# Patient Record
Sex: Male | Born: 1995 | Race: Black or African American | Hispanic: No | Marital: Single | Smoking: Never smoker
Health system: Southern US, Community
[De-identification: ages and names within clinical notes are randomized; demographics above are authoritative.]

## PROBLEM LIST (undated history)

## (undated) DIAGNOSIS — J45909 Unspecified asthma, uncomplicated: Secondary | ICD-10-CM

## (undated) DIAGNOSIS — A549 Gonococcal infection, unspecified: Secondary | ICD-10-CM

## (undated) HISTORY — PX: WISDOM TOOTH EXTRACTION: SHX21

---
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
## 2018-05-24 ENCOUNTER — Other Ambulatory Visit: Payer: Self-pay

## 2018-05-24 ENCOUNTER — Emergency Department (HOSPITAL_BASED_OUTPATIENT_CLINIC_OR_DEPARTMENT_OTHER)
Admission: EM | Admit: 2018-05-24 | Discharge: 2018-05-24 | Disposition: A | Discharge status: Home / Self Care | Payer: BLUE CROSS/BLUE SHIELD | Attending: Emergency Medicine | Admitting: Emergency Medicine

## 2018-05-24 ENCOUNTER — Encounter (HOSPITAL_BASED_OUTPATIENT_CLINIC_OR_DEPARTMENT_OTHER): Payer: Self-pay

## 2018-05-24 DIAGNOSIS — Z202 Contact with and (suspected) exposure to infections with a predominantly sexual mode of transmission: Secondary | ICD-10-CM | POA: Diagnosis not present

## 2018-05-24 HISTORY — DX: Gonococcal infection, unspecified: A54.9

## 2018-05-24 LAB — URINALYSIS, ROUTINE W REFLEX MICROSCOPIC
Bilirubin Urine: NEGATIVE
GLUCOSE, UA: NEGATIVE mg/dL
HGB URINE DIPSTICK: NEGATIVE
KETONES UR: NEGATIVE mg/dL
LEUKOCYTES UA: NEGATIVE
Nitrite: NEGATIVE
PH: 8 (ref 5.0–8.0)
PROTEIN: NEGATIVE mg/dL
Specific Gravity, Urine: 1.02 (ref 1.005–1.030)

## 2018-05-24 MED ORDER — AZITHROMYCIN 250 MG PO TABS
1000.0000 mg | ORAL_TABLET | Freq: Once | ORAL | Status: AC
  Administered 2018-05-24: 1000 mg via ORAL
  Filled 2018-05-24: qty 4

## 2018-05-24 MED ORDER — CEFTRIAXONE SODIUM 250 MG IJ SOLR
250.0000 mg | Freq: Once | INTRAMUSCULAR | Status: AC
  Administered 2018-05-24: 250 mg via INTRAMUSCULAR
  Filled 2018-05-24: qty 250

## 2018-05-24 NOTE — Discharge Instructions (Signed)

## 2018-05-24 NOTE — ED Notes (Signed)
Pt verbalizes understanding of d/c instructions and denies any further needs at this time. 

## 2018-05-24 NOTE — ED Provider Notes (Signed)
Emergency Department Provider Note   I have reviewed the triage vital signs and the nursing notes.   HISTORY  Chief Complaint No chief complaint on file.   HPI Alejandro Lyons is a 22 y.o. male presents to the ED with symptoms similar to prior STDs. Patient has been tested and treated in the last month for STI symptoms. He notes change in semen consistency and slight darker color. He feels some urethral itching but denies any urethral discharge. Patient was [facility]. He was tested for HIV during his most recent event. He had unprotected oral sex with someone shortly afterwards and began having symptoms again shortly afterwards. No testicle pain. No fever/chills.   Past Medical History:  Diagnosis Date  . Gonorrhea     There are no active problems to display for this patient.   Past Surgical History:  Procedure Laterality Date  . WISDOM TOOTH EXTRACTION        Allergies Patient has no known allergies.  No family history on file.  Social History Social History   Tobacco Use  . Smoking status: Never Smoker  . Smokeless tobacco: Never Used  Substance Use Topics  . Alcohol use: Yes    Comment: occ  . Drug use: Never    Review of Systems  Constitutional: No fever/chills Genitourinary: Negative for dysuria. Positive change in semen consistency and itching feeling.  Musculoskeletal: Negative for back pain. Skin: Negative for rash.  10-point ROS otherwise negative.  ____________________________________________   PHYSICAL EXAM:  VITAL SIGNS: ED Triage Vitals  Enc Vitals Group     BP 05/24/18 2033 134/85     Pulse Rate 05/24/18 2033 79     Resp 05/24/18 2033 18     Temp 05/24/18 2033 98.3 F (36.8 C)     Temp Source 05/24/18 2033 Oral     SpO2 05/24/18 2033 100 %     Weight 05/24/18 2034 155 lb (70.3 kg)     Height 05/24/18 2034 6' (1.829 m)     Pain Score 05/24/18 2031 5   Constitutional: Alert and  oriented. Well appearing and in no acute distress. Eyes: Conjunctivae are normal. Head: Atraumatic. Nose: No congestion/rhinnorhea. Mouth/Throat: Mucous membranes are moist. Neck: No stridor.   Genitourinary: Normal circumcised external genitalia. No urethral discharge. No testicle tenderness.   Musculoskeletal: No gross deformities of extremities. Neurologic:  Normal speech and language. Skin: No rash noted.  ____________________________________________   LABS (all labs ordered are listed, but only abnormal results are displayed)  Labs Reviewed  URINALYSIS, ROUTINE W REFLEX MICROSCOPIC - Abnormal; Notable for the following components:      Result Value   APPearance CLOUDY (*)    All other components within normal limits  GC/CHLAMYDIA PROBE AMP ([place]) NOT AT [facility]   ____________________________________________   PROCEDURES  Procedure(s) performed:   Procedures  None ____________________________________________   INITIAL IMPRESSION / ASSESSMENT AND PLAN / ED COURSE  Pertinent labs & imaging results that were available during my care of the patient were reviewed by me and considered in my medical decision making (see chart for details).   Patient presents to the ED with symptoms similar to recent STD. He has had an unprotected sexual encounter since his most recent STD treatment. Elect to cover empirically here. Had HIV testing in the last 30 days. Will not repeat here. Discussed safe sex practices and encouraged close f/u with student health shoulder symptoms persist.    ____________________________________________  FINAL CLINICAL IMPRESSION(S) / ED DIAGNOSES  Final diagnoses:  STD exposure     MEDICATIONS GIVEN DURING THIS VISIT:  Medications  azithromycin (ZITHROMAX) tablet 1,000 mg (1,000 mg Oral Given 05/24/18 2055)  cefTRIAXone (ROCEPHIN) injection 250 mg (250 mg Intramuscular Given 05/24/18 2056)    Note:  This document was prepared using Dragon  voice recognition software and may include unintentional dictation errors.  Alona Bene, MD Emergency Medicine    , Arlyss Repress, MD 05/25/18 [phone]

## 2018-05-24 NOTE — ED Triage Notes (Signed)
C/o change in semen concentration x 2 days-feels same as when he had STD-NAD-steady gait

## 2018-05-25 LAB — GC/CHLAMYDIA PROBE AMP (~~LOC~~) NOT AT ARMC
CHLAMYDIA, DNA PROBE: NEGATIVE
Neisseria Gonorrhea: NEGATIVE

## 2019-01-11 ENCOUNTER — Emergency Department (HOSPITAL_BASED_OUTPATIENT_CLINIC_OR_DEPARTMENT_OTHER)
Admission: EM | Admit: 2019-01-11 | Discharge: 2019-01-11 | Disposition: A | Discharge status: Home / Self Care | Payer: BC Managed Care – PPO | Attending: Emergency Medicine | Admitting: Emergency Medicine

## 2019-01-11 ENCOUNTER — Other Ambulatory Visit: Payer: Self-pay

## 2019-01-11 ENCOUNTER — Encounter (HOSPITAL_BASED_OUTPATIENT_CLINIC_OR_DEPARTMENT_OTHER): Payer: Self-pay | Admitting: *Deleted

## 2019-01-11 DIAGNOSIS — J45909 Unspecified asthma, uncomplicated: Secondary | ICD-10-CM | POA: Diagnosis not present

## 2019-01-11 DIAGNOSIS — R195 Other fecal abnormalities: Secondary | ICD-10-CM | POA: Insufficient documentation

## 2019-01-11 HISTORY — DX: Unspecified asthma, uncomplicated: J45.909

## 2019-01-11 NOTE — Discharge Instructions (Addendum)
Follow up with GI, referral given. Call for your test results in 3-5 days or check your mychart, if your test is positive you may go to the health department for treatment.

## 2019-01-11 NOTE — ED Provider Notes (Signed)
[facility] EMERGENCY DEPARTMENT Provider Note   CSN: [id number] Arrival date & time: 01/11/19  1359    History   Chief Complaint Chief Complaint  Patient presents with  . Mucous in stool    HPI Anastasio Ebony CargoFelton Stackhouse is a 23 y.o. male.     23 year old male presents with complaint of mucus in his stool for the past month.  Patient notes sometimes going to the bathroom and only having stool in the commode.  Patient denies any blood, abdominal pain, nausea, vomiting, fevers.  Denies unintentional weight loss.  Patient eats a generally healthy, high-fiber diet.  Patient reports doing a colon cleanse around the time his symptoms started and is unsure if it is because he used warm water (instructions specifically said not to use warm water).  Patient participates in anal intercourse, states has not been sexually active since his symptoms started.  No personal or family history of colitis, IBS, Crohn's disease.  No other complaints or concerns.     Past Medical History:  Diagnosis Date  . Asthma   . Gonorrhea     There are no active problems to display for this patient.   Past Surgical History:  Procedure Laterality Date  . WISDOM TOOTH EXTRACTION          Home Medications    Prior to Admission medications   Not on File    Family History History reviewed. No pertinent family history.  Social History Social History   Tobacco Use  . Smoking status: Never Smoker  . Smokeless tobacco: Never Used  Substance Use Topics  . Alcohol use: Yes    Alcohol/week: 1.0 standard drinks    Types: 1 Shots of liquor per week    Comment: occ  . Drug use: Never     Allergies   Patient has no known allergies.   Review of Systems Review of Systems  Constitutional: Negative for appetite change, chills, fever and unexpected weight change.  Gastrointestinal: Negative for abdominal pain, anal bleeding, blood in stool, constipation, diarrhea, nausea and vomiting.   Skin: Negative for rash and wound.  Allergic/Immunologic: Negative for immunocompromised state.  Neurological: Negative for weakness.  Psychiatric/Behavioral: Negative for confusion.  All other systems reviewed and are negative.    Physical Exam Updated Vital Signs BP (!) 142/94 (BP Location: Right Arm)   Pulse 100   Temp 98.3 F (36.8 C)   Resp 18   Ht 6\' 1"  (1.854 m)   Wt 71.9 kg   SpO2 100%   BMI 20.90 kg/m   Physical Exam Vitals signs and nursing note reviewed. Exam conducted with a chaperone present.  Constitutional:      General: He is not in acute distress.    Appearance: He is well-developed. He is not diaphoretic.  HENT:     Head: Normocephalic and atraumatic.  Cardiovascular:     Rate and Rhythm: Normal rate and regular rhythm.     Pulses: Normal pulses.     Heart sounds: Normal heart sounds.  Pulmonary:     Effort: Pulmonary effort is normal.  Abdominal:     General: Abdomen is flat.     Palpations: Abdomen is soft.     Tenderness: There is no abdominal tenderness.  Skin:    General: Skin is warm and dry.     Findings: No erythema or rash.  Neurological:     Mental Status: He is alert and oriented to person, place, and time.  Psychiatric:  Behavior: Behavior normal.      ED Treatments / Results  Labs (all labs ordered are listed, but only abnormal results are displayed) Labs Reviewed  GC/CHLAMYDIA PROBE AMP ([place]) NOT AT [facility]    EKG None  Radiology No results found.  Procedures Procedures (including critical care time)  Medications Ordered in ED Medications - No data to display   Initial Impression / Assessment and Plan / ED Course  I have reviewed the triage vital signs and the nursing notes.  Pertinent labs & imaging results that were available during my care of the patient were reviewed by me and considered in my medical decision making (see chart for details).  Clinical Course as of Jan 11 1552  Wed Jan 10, 3325   7639 23 year old male with complaint of mucus per rectum and mucus in his stool for the past month.  Patient does not have abdominal pain, blood in the stool or mucus, fevers, weight loss.  Abdomen is soft and nontender.  Offered patient gonorrhea chlamydia testing for STD, patient agreeable, swab obtained from the rectum with RN assistant chaperone.  Patient to call or check his MyChart up in 3 to 5 days, present to health department for treatment if needed.  Referred to gastroenterology for follow-up for report of mucus in stool x1 month.  Advised patient this may be diet related as he does not appear ill or have any symptoms correlating with illness today.   [LM]    Clinical Course User Index [LM] Tacy Learn, PA-C      Final Clinical Impressions(s) / ED Diagnoses   Final diagnoses:  Mucous in stools    ED Discharge Orders    None       Roque Lias 01/11/19 1553    Dorie Rank, MD 01/12/19 1202

## 2019-01-11 NOTE — ED Triage Notes (Signed)
Mucous in stool for a month. Some pressure to hypogastric area.

## 2019-01-12 LAB — GC/CHLAMYDIA PROBE AMP (~~LOC~~) NOT AT ARMC
Chlamydia: NEGATIVE
Neisseria Gonorrhea: POSITIVE — AB

## 2019-01-14 ENCOUNTER — Encounter (HOSPITAL_BASED_OUTPATIENT_CLINIC_OR_DEPARTMENT_OTHER): Payer: Self-pay | Admitting: Emergency Medicine

## 2019-01-14 ENCOUNTER — Emergency Department (HOSPITAL_BASED_OUTPATIENT_CLINIC_OR_DEPARTMENT_OTHER)
Admission: EM | Admit: 2019-01-14 | Discharge: 2019-01-14 | Disposition: A | Discharge status: Home / Self Care | Payer: BC Managed Care – PPO | Attending: Emergency Medicine | Admitting: Emergency Medicine

## 2019-01-14 ENCOUNTER — Other Ambulatory Visit: Payer: Self-pay

## 2019-01-14 DIAGNOSIS — J45909 Unspecified asthma, uncomplicated: Secondary | ICD-10-CM | POA: Insufficient documentation

## 2019-01-14 DIAGNOSIS — A549 Gonococcal infection, unspecified: Secondary | ICD-10-CM | POA: Insufficient documentation

## 2019-01-14 MED ORDER — DOXYCYCLINE HYCLATE 100 MG PO CAPS: 100.0000 mg | ORAL_CAPSULE | Freq: Two times a day (BID) | ORAL | 0 refills | Status: AC

## 2019-01-14 MED ORDER — DOXYCYCLINE HYCLATE 100 MG PO CAPS: 100.0000 mg | ORAL_CAPSULE | Freq: Two times a day (BID) | ORAL | 0 refills | Status: DC

## 2019-01-14 MED ORDER — CEFTRIAXONE SODIUM 250 MG IJ SOLR
250.0000 mg | INTRAMUSCULAR | Status: DC
  Administered 2019-01-14: 250 mg via INTRAMUSCULAR
  Filled 2019-01-14: qty 250

## 2019-01-14 NOTE — ED Triage Notes (Signed)
Patient states that he was here the other day and came back because one of his tests say that he has gonorrhea

## 2019-01-14 NOTE — ED Notes (Signed)
Patient on med hold until 1700

## 2019-01-14 NOTE — Discharge Instructions (Addendum)
Make sure partner(s) are aware of their results and treated, this can be done at the health department. Consider follow up with your doctor or the health department for HIV testing.

## 2019-01-14 NOTE — ED Provider Notes (Signed)
[facility] EMERGENCY DEPARTMENT Provider Note   CSN: [id number] Arrival date & time: 01/14/19  1556    History   Chief Complaint Chief Complaint  Patient presents with  . Follow-up    HPI Alejandro Lyons is a 23 y.o. male.     23 year old male presents for treatment of gonorrhea.  Patient was seen in the emergency room 3 days ago with report of mucous stools, rectal swab for gonorrhea and results are positive.  No further complaints or concerns today.     Past Medical History:  Diagnosis Date  . Asthma   . Gonorrhea     There are no active problems to display for this patient.   Past Surgical History:  Procedure Laterality Date  . WISDOM TOOTH EXTRACTION          Home Medications    Prior to Admission medications   Medication Sig Start Date End Date Taking? Authorizing Provider  doxycycline (VIBRAMYCIN) 100 MG capsule Take 1 capsule (100 mg total) by mouth 2 (two) times daily for 7 days. 01/14/19 01/21/19  Jeannie FendMurphy, Starkeisha Vanwinkle A, PA-C    Family History History reviewed. No pertinent family history.  Social History Social History   Tobacco Use  . Smoking status: Never Smoker  . Smokeless tobacco: Never Used  Substance Use Topics  . Alcohol use: Yes    Alcohol/week: 1.0 standard drinks    Types: 1 Shots of liquor per week    Comment: occ  . Drug use: Never     Allergies   Patient has no known allergies.   Review of Systems Review of Systems  Constitutional: Negative for fever.  Musculoskeletal: Negative for arthralgias, joint swelling and myalgias.  Skin: Negative for rash and wound.  Allergic/Immunologic: Negative for immunocompromised state.  All other systems reviewed and are negative.    Physical Exam Updated Vital Signs BP (!) 125/58 (BP Location: Right Arm)   Pulse 88   Temp 98.5 F (36.9 C) (Oral)   Resp 18   Ht 6\' 1"  (1.854 m)   Wt 71.9 kg   SpO2 100%   BMI 20.91 kg/m   Physical Exam Vitals signs and  nursing note reviewed.  Constitutional:      General: He is not in acute distress.    Appearance: He is well-developed. He is not diaphoretic.  HENT:     Head: Normocephalic and atraumatic.  Pulmonary:     Effort: Pulmonary effort is normal.  Skin:    General: Skin is warm and dry.     Findings: No erythema or rash.  Neurological:     Mental Status: He is alert and oriented to person, place, and time.  Psychiatric:        Behavior: Behavior normal.      ED Treatments / Results  Labs (all labs ordered are listed, but only abnormal results are displayed) Labs Reviewed - No data to display  EKG None  Radiology No results found.  Procedures Procedures (including critical care time)  Medications Ordered in ED Medications  cefTRIAXone (ROCEPHIN) injection 250 mg (has no administration in time range)     Initial Impression / Assessment and Plan / ED Course  I have reviewed the triage vital signs and the nursing notes.  Pertinent labs & imaging results that were available during my care of the patient were reviewed by me and considered in my medical decision making (see chart for details).  Clinical Course as of Jan 13 1622  Sat  Jan 13, 3190  2238 23 year old male returns to ER for treatment of gonorrhea.  Patient will be given Rocephin IM, prescription for doxycycline x7 days.  Offered additional testing for HIV and syphilis, patient declines at this time will follow-up with his PCP.  Patient is advised to inform all partners for treatment.   [LM]    Clinical Course User Index [LM] Tacy Learn, PA-C      Final Clinical Impressions(s) / ED Diagnoses   Final diagnoses:  Gonorrhea    ED Discharge Orders         Ordered    doxycycline (VIBRAMYCIN) 100 MG capsule  2 times daily,   Status:  Discontinued     01/14/19 1615    doxycycline (VIBRAMYCIN) 100 MG capsule  2 times daily     01/14/19 1618           Tacy Learn, PA-C 01/14/19 1624     Gareth Morgan, MD 01/14/19 1723

## 2020-10-05 ENCOUNTER — Emergency Department (HOSPITAL_BASED_OUTPATIENT_CLINIC_OR_DEPARTMENT_OTHER)
Admission: EM | Admit: 2020-10-05 | Discharge: 2020-10-05 | Disposition: A | Discharge status: Home / Self Care | Payer: BLUE CROSS/BLUE SHIELD | Attending: Emergency Medicine | Admitting: Emergency Medicine

## 2020-10-05 ENCOUNTER — Other Ambulatory Visit: Payer: Self-pay

## 2020-10-05 ENCOUNTER — Encounter (HOSPITAL_BASED_OUTPATIENT_CLINIC_OR_DEPARTMENT_OTHER): Payer: Self-pay | Admitting: Emergency Medicine

## 2020-10-05 DIAGNOSIS — J45909 Unspecified asthma, uncomplicated: Secondary | ICD-10-CM | POA: Diagnosis not present

## 2020-10-05 DIAGNOSIS — N342 Other urethritis: Secondary | ICD-10-CM | POA: Diagnosis not present

## 2020-10-05 DIAGNOSIS — R369 Urethral discharge, unspecified: Secondary | ICD-10-CM | POA: Diagnosis present

## 2020-10-05 LAB — URINALYSIS, ROUTINE W REFLEX MICROSCOPIC
Bilirubin Urine: NEGATIVE
Glucose, UA: NEGATIVE mg/dL
Ketones, ur: NEGATIVE mg/dL
Nitrite: NEGATIVE
Protein, ur: NEGATIVE mg/dL
Specific Gravity, Urine: 1.02 (ref 1.005–1.030)
pH: 6 (ref 5.0–8.0)

## 2020-10-05 LAB — URINALYSIS, MICROSCOPIC (REFLEX)

## 2020-10-05 MED ORDER — LIDOCAINE HCL (PF) 1 % IJ SOLN
2.0000 mL | Freq: Once | INTRAMUSCULAR | Status: AC
  Administered 2020-10-05: 2 mL
  Filled 2020-10-05: qty 5

## 2020-10-05 MED ORDER — CEFTRIAXONE SODIUM 500 MG IJ SOLR
500.0000 mg | Freq: Once | INTRAMUSCULAR | Status: AC
  Administered 2020-10-05: 500 mg via INTRAMUSCULAR
  Filled 2020-10-05: qty 500

## 2020-10-05 MED ORDER — DOXYCYCLINE HYCLATE 100 MG PO TABS: 100.0000 mg | ORAL_TABLET | Freq: Two times a day (BID) | ORAL | 0 refills | Status: DC

## 2020-10-05 NOTE — ED Provider Notes (Signed)
[facility] EMERGENCY DEPARTMENT Provider Note   CSN: [id number] Arrival date & time: 10/05/20  1901     History Chief Complaint  Patient presents with  . Penile Discharge    Alejandro Lyons is a 25 y.o. male.  The history is provided by the patient. No language interpreter was used.  Penile Discharge This is a new problem. The current episode started 12 to 24 hours ago. The problem occurs constantly. The problem has not changed since onset.Nothing aggravates the symptoms. Nothing relieves the symptoms.  Pt complains of penile discharge.  Pt reports possible std.      Past Medical History:  Diagnosis Date  . Asthma   . Gonorrhea     There are no problems to display for this patient.   Past Surgical History:  Procedure Laterality Date  . WISDOM TOOTH EXTRACTION         History reviewed. No pertinent family history.  Social History   Tobacco Use  . Smoking status: Never Smoker  . Smokeless tobacco: Never Used  Substance Use Topics  . Alcohol use: Yes    Alcohol/week: 1.0 standard drink    Types: 1 Shots of liquor per week    Comment: occ  . Drug use: Never    Home Medications Prior to Admission medications   Medication Sig Start Date End Date Taking? Authorizing Provider  doxycycline (VIBRA-TABS) 100 MG tablet Take 1 tablet (100 mg total) by mouth 2 (two) times daily. 10/05/20  Yes Elson Areas, PA-C    Allergies    Patient has no known allergies.  Review of Systems   Review of Systems  Genitourinary: Positive for penile discharge.  All other systems reviewed and are negative.   Physical Exam Updated Vital Signs BP 140/82   Pulse 81   Temp 98.3 F (36.8 C) (Oral)   Resp 20   Ht 6\' 1"  (1.854 m)   Wt 83.9 kg   SpO2 100%   BMI 24.41 kg/m   Physical Exam Vitals and nursing note reviewed.  Constitutional:      Appearance: He is well-developed.  HENT:     Head: Normocephalic and atraumatic.  Eyes:      Conjunctiva/sclera: Conjunctivae normal.  Cardiovascular:     Rate and Rhythm: Normal rate and regular rhythm.     Heart sounds: No murmur heard.   Pulmonary:     Effort: Pulmonary effort is normal. No respiratory distress.     Breath sounds: Normal breath sounds.  Abdominal:     Palpations: Abdomen is soft.     Tenderness: There is no abdominal tenderness.  Musculoskeletal:     Cervical back: Neck supple.  Skin:    General: Skin is warm and dry.  Neurological:     Mental Status: He is alert.  Psychiatric:        Mood and Affect: Mood normal.     ED Results / Procedures / Treatments   Labs (all labs ordered are listed, but only abnormal results are displayed) Labs Reviewed  URINALYSIS, ROUTINE W REFLEX MICROSCOPIC  GC/CHLAMYDIA PROBE AMP ([place]) NOT AT [facility]    EKG None  Radiology No results found.  Procedures Procedures   Medications Ordered in ED Medications  cefTRIAXone (ROCEPHIN) injection 500 mg (has no administration in time range)    ED Course  I have reviewed the triage vital signs and the nursing notes.  Pertinent labs & imaging results that were available during my care of the  patient were reviewed by me and considered in my medical decision making (see chart for details).    MDM Rules/Calculators/A&P                          gc and ct pending.  Pt declined rpr and hiv.  Pt given Rocephin 500mg  Im.   Final Clinical Impression(s) / ED Diagnoses Final diagnoses:  Urethritis    Rx / DC Orders ED Discharge Orders         Ordered    doxycycline (VIBRA-TABS) 100 MG tablet  2 times daily        10/05/20 1949        An After Visit Summary was printed and given to the patient.   12/05/20 10/05/20 1951    12/05/20, MD 10/05/20 2012

## 2020-10-05 NOTE — ED Triage Notes (Signed)
Reports penile discharge and dysuria since yesterday. Told by partner to get checked for STDs.

## 2020-10-05 NOTE — Discharge Instructions (Addendum)
Return if any problems.

## 2020-10-07 LAB — GC/CHLAMYDIA PROBE AMP (~~LOC~~) NOT AT ARMC
Chlamydia: NEGATIVE
Comment: NEGATIVE
Comment: NORMAL
Neisseria Gonorrhea: POSITIVE — AB

## 2020-12-13 ENCOUNTER — Other Ambulatory Visit: Payer: Self-pay

## 2020-12-13 ENCOUNTER — Encounter (HOSPITAL_BASED_OUTPATIENT_CLINIC_OR_DEPARTMENT_OTHER): Payer: Self-pay

## 2020-12-13 ENCOUNTER — Emergency Department (HOSPITAL_BASED_OUTPATIENT_CLINIC_OR_DEPARTMENT_OTHER)
Admission: EM | Admit: 2020-12-13 | Discharge: 2020-12-13 | Disposition: A | Discharge status: Home / Self Care | Payer: BC Managed Care – PPO | Attending: Emergency Medicine | Admitting: Emergency Medicine

## 2020-12-13 DIAGNOSIS — R300 Dysuria: Secondary | ICD-10-CM | POA: Diagnosis not present

## 2020-12-13 DIAGNOSIS — J45909 Unspecified asthma, uncomplicated: Secondary | ICD-10-CM | POA: Diagnosis not present

## 2020-12-13 DIAGNOSIS — R369 Urethral discharge, unspecified: Secondary | ICD-10-CM | POA: Diagnosis not present

## 2020-12-13 DIAGNOSIS — Z202 Contact with and (suspected) exposure to infections with a predominantly sexual mode of transmission: Secondary | ICD-10-CM | POA: Diagnosis not present

## 2020-12-13 DIAGNOSIS — Z711 Person with feared health complaint in whom no diagnosis is made: Secondary | ICD-10-CM

## 2020-12-13 LAB — URINALYSIS, ROUTINE W REFLEX MICROSCOPIC
Bilirubin Urine: NEGATIVE
Glucose, UA: NEGATIVE mg/dL
Hgb urine dipstick: NEGATIVE
Ketones, ur: NEGATIVE mg/dL
Leukocytes,Ua: NEGATIVE
Nitrite: NEGATIVE
Protein, ur: NEGATIVE mg/dL
Specific Gravity, Urine: 1.01 (ref 1.005–1.030)
pH: 7.5 (ref 5.0–8.0)

## 2020-12-13 LAB — RAPID HIV SCREEN (HIV 1/2 AB+AG)
HIV 1/2 Antibodies: NONREACTIVE
HIV-1 P24 Antigen - HIV24: NONREACTIVE

## 2020-12-13 MED ORDER — DOXYCYCLINE HYCLATE 100 MG PO CAPS: 100.0000 mg | ORAL_CAPSULE | Freq: Two times a day (BID) | ORAL | 0 refills | Status: AC

## 2020-12-13 MED ORDER — DOXYCYCLINE HYCLATE 100 MG PO TABS
100.0000 mg | ORAL_TABLET | Freq: Once | ORAL | Status: AC
  Administered 2020-12-13: 20:00:00 100 mg via ORAL
  Filled 2020-12-13: qty 1

## 2020-12-13 MED ORDER — LIDOCAINE HCL (PF) 1 % IJ SOLN
1.0000 mL | Freq: Once | INTRAMUSCULAR | Status: AC
  Administered 2020-12-13: 20:00:00 1 mL
  Filled 2020-12-13: qty 5

## 2020-12-13 MED ORDER — CEFTRIAXONE SODIUM 500 MG IJ SOLR
500.0000 mg | Freq: Once | INTRAMUSCULAR | Status: AC
  Administered 2020-12-13: 20:00:00 500 mg via INTRAMUSCULAR
  Filled 2020-12-13: qty 500

## 2020-12-13 NOTE — ED Provider Notes (Signed)
[facility] EMERGENCY DEPARTMENT Provider Note   CSN: [id number] Arrival date & time: 12/13/20  1908     History Chief Complaint  Patient presents with   Penile Discharge    Alejandro Lyons is a 25 y.o. male.  HPI Patient is a 25 year old male who is sexually active with male partners.  He states that for the past 2 days he has had some burning with urination and some penile discharge.  Denies any other symptoms no ulcers or swelling.  He states that he has been only penetrating his primary states that he has not been anally penetrated himself in the past several months.  He states that he does not use condoms as preventative measures.  He has good insight into the risks that this places him in.  He states that he is planning to follow-up with his primary care doctor to discuss being placed on prep but has not yet done this.  He has a history of STDs in the past but does not recall which ones he has had.  Denies any fevers or chills lightheadedness or dizziness no other significant associated symptoms.  He denies any testicular swelling or pain.    Past Medical History:  Diagnosis Date   Asthma    Gonorrhea     There are no problems to display for this patient.   Past Surgical History:  Procedure Laterality Date   WISDOM TOOTH EXTRACTION         No family history on file.  Social History   Tobacco Use   Smoking status: Never   Smokeless tobacco: Never  Vaping Use   Vaping Use: Never used  Substance Use Topics   Alcohol use: Yes    Alcohol/week: 1.0 standard drink    Types: 1 Shots of liquor per week    Comment: occ   Drug use: Never    Home Medications Prior to Admission medications   Medication Sig Start Date End Date Taking? Authorizing Provider  doxycycline (VIBRAMYCIN) 100 MG capsule Take 1 capsule (100 mg total) by mouth 2 (two) times daily for 7 days. 12/13/20 12/20/20 Yes Gailen Shelter, PA    Allergies    Patient has no known  allergies.  Review of Systems   Review of Systems  Constitutional:  Negative for chills and fever.  HENT:  Negative for congestion.   Eyes:  Negative for pain.  Respiratory:  Negative for cough and shortness of breath.   Cardiovascular:  Negative for chest pain and leg swelling.  Gastrointestinal:  Negative for abdominal pain and vomiting.  Genitourinary:  Positive for dysuria and penile discharge.  Musculoskeletal:  Negative for myalgias.  Skin:  Negative for rash.  Neurological:  Negative for dizziness and headaches.   Physical Exam Updated Vital Signs BP (!) 149/91 (BP Location: Left Arm)   Pulse 61   Temp 98.1 F (36.7 C) (Oral)   Resp 20   Ht 6\' 1"  (1.854 m)   Wt 85.3 kg   SpO2 100%   BMI 24.80 kg/m   Physical Exam Vitals and nursing note reviewed.  Constitutional:      General: He is not in acute distress.    Appearance: Normal appearance. He is not ill-appearing.  HENT:     Head: Normocephalic and atraumatic.  Eyes:     General: No scleral icterus.       Right eye: No discharge.        Left eye: No discharge.  Conjunctiva/sclera: Conjunctivae normal.  Pulmonary:     Effort: Pulmonary effort is normal.     Breath sounds: No stridor.  Genitourinary:    Comments: Testes normal lie.  There is some clear discharge from urethral meatus with exam Neurological:     Mental Status: He is alert and oriented to person, place, and time. Mental status is at baseline.    ED Results / Procedures / Treatments   Labs (all labs ordered are listed, but only abnormal results are displayed) Labs Reviewed  RPR  URINALYSIS, ROUTINE W REFLEX MICROSCOPIC  HIV ANTIBODY (ROUTINE TESTING W REFLEX)  RAPID HIV SCREEN (HIV 1/2 AB+AG)  GC/CHLAMYDIA PROBE AMP ([place]) NOT AT [facility]    EKG None  Radiology No results found.  Procedures Procedures   Medications Ordered in ED Medications  cefTRIAXone (ROCEPHIN) injection 500 mg (500 mg Intramuscular Given 12/13/20 2014)   lidocaine (PF) (XYLOCAINE) 1 % injection 1 mL (1 mL Other Given 12/13/20 2014)  doxycycline (VIBRA-TABS) tablet 100 mg (100 mg Oral Given 12/13/20 2014)    ED Course  I have reviewed the triage vital signs and the nursing notes.  Pertinent labs & imaging results that were available during my care of the patient were reviewed by me and considered in my medical decision making (see chart for details).    MDM Rules/Calculators/A&P                          Concern for gonorrhea/chlamydia with patient's penile discharge.  Given STD precautions had lengthy discussion on his safety.  Treated with doxycycline and Rocephin.  Return precautions given.  Also checking for HIV and syphilis..  Patient is overall well-appearing.  All questions answered best my ability.  Return precautions given and follow-up instructions to follow-up with PCP and health department.  Final Clinical Impression(s) / ED Diagnoses Final diagnoses:  Concern about STD in male without diagnosis    Rx / DC Orders ED Discharge Orders          Ordered    doxycycline (VIBRAMYCIN) 100 MG capsule  2 times daily        12/13/20 1943             Gailen Shelter, [place] 12/13/20 2016    Terald Sleeper, MD 12/14/20 1055

## 2020-12-13 NOTE — ED Triage Notes (Signed)
Pt c/o penile d/c and dysuria x 3 days-NAD-steady gait 

## 2020-12-13 NOTE — Discharge Instructions (Addendum)
Please refrain from sex for the next 1 week.  Please take antibiotics for the entire course even if your symptoms improve.  Talk to partners about being tested.  I strongly recommend using a condom and also talk to your partner about using a condom for penetrative sex as well.  Please read the attached information on sexually transmitted diseases.  You have been treated in the emergency department for an infection, possibly sexually transmitted. Results of your gonorrhea and chlamydia tests are pending and you will be notified if they are positive. Please refrain from intercourse for 7 days and until all sex partners (within previous 60 days) are evaluated and/or treated as well. Please follow up with your primary care provider for continued care and further STD evaluation.  It is very important to practice safe sex and use condoms when sexually active. If your results are positive you need to notify all sexual partners so they can be treated as well. The website [email or web address] can be used to send anonymous text messages or emails to alert sexual contacts. Follow up with your doctor, or OBGYN in regards to today's visit.   Gonorrhea and Chlamydia SYMPTOMS  In females, symptoms may go unnoticed. Symptoms that are more noticeable can include:  Belly (abdominal) pain.  Painful intercourse.  Watery mucous-like discharge from the vagina.  Miscarriage.  Discomfort when urinating.  Inflammation of the rectum.  Abnormal gray-green frothy vaginal discharge  Vaginal itching and irritatio  Itching and irritation of the area outside the vagina.   Painful urination.  Bleeding after sexual intercourse.  In males, symptoms include:  Burning with urination.  Pain in the testicles.  Watery mucous-like discharge from the penis.  It can cause longstanding (chronic) pelvic pain after frequent infections.  TREATMENT  PID can cause women to not be able to have children (sterile) if left untreated  or if half-treated.  It is important to finish ALL medications given to you.  This is a sexually transmitted infection. So you are also at risk for other sexually transmitted diseases, including HIV (AIDS), it is recommended that you get tested. HOME CARE INSTRUCTIONS  Warning: This infection is contagious. Do not have sex until treatment is completed. Follow up at your caregiver's office or the clinic to which you were referred. If your diagnosis (learning what is wrong) is confirmed by culture or some other method, your recent sexual contacts need treatment. Even if they are symptom free or have a negative culture or evaluation, they should be treated.  PREVENTION  Women should use sanitary pads instead of tampons for vaginal discharge.  Wipe front to back after using the toilet and avoid douching.   Practice safe sex, use condoms, have only one sex partner and be sure your sex partner is not having sex with others.  Ask your caregiver to test you for chlamydia at your regular checkups or sooner if you are having symptoms.  Ask for further information if you are pregnant.  SEEK IMMEDIATE MEDICAL CARE IF:  You develop an oral temperature above 102 F (38.9 C), not controlled by medications or lasting more than 2 days.  You develop an increase in pain.  You develop any type of abnormal discharge.  You develop vaginal bleeding and it is not time for your period.  You develop painful intercourse.   Bacterial Vaginosis  Bacterial vaginosis (BV) is a vaginal infection where the normal balance of bacteria in the vagina is disrupted. This is not a sexually transmitted disease  and your sexual partners do NOT need to be treated. CAUSES  The cause of BV is not fully understood. BV develops when there is an increase or imbalance of harmful bacteria.  Some activities or behaviors can upset the normal balance of bacteria in the vagina and put women at increased risk including:  Having a new sex partner or  multiple sex partners.  Douching.  Using an intrauterine device (IUD) for contraception.  It is not clear what role sexual activity plays in the development of BV. However, women that have never had sexual intercourse are rarely infected with BV.  Women do not get BV from toilet seats, bedding, swimming pools or from touching objects around them.   SYMPTOMS  Grey vaginal discharge.  A fish-like odor with discharge, especially after sexual intercourse.  Itching or burning of the vagina and vulva.  Burning or pain with urination.  Some women have no signs or symptoms at all.   TREATMENT  Sometimes BV will clear up without treatment.  BV may be treated with antibiotics.  BV can recur after treatment. If this happens, a second round of antibiotics will often be prescribed.  HOME CARE INSTRUCTIONS  Finish all medication as directed by your caregiver.  Do not have sex until treatment is completed.  Do NOT drink any alcoholic beverages while being treated  with Metronidazole (Flagyl). This will cause a severe reaction inducing vomiting.  RESOURCE GUIDE  Dental Problems  Patients with Medicaid: [facility] [phone]                                           [phone] [address] Phone:  [phone]                                                  Phone:  [phone]  If unable to pay or uninsured, contact:  [facility] or [facility]. to become qualified for the adult dental clinic.  Chronic Pain Problems Contact [facility]  [phone] Patients need to be referred by their primary care doctor.  Insufficient Money for Medicine Contact [employer]:  call "211" or [facility] [phone].  No Primary Care Doctor Call [facility]  5856299213 Other agencies that provide inexpensive medical care    [facility]  [phone]    [facility]  [phone]    [facility]  [phone]    [facility]  [phone]    [facility]  [phone]    [facility]  [phone]  Psychological Services [facility]  [phone] [facility]  [phone] [facility]   [phone] (emergency services [phone])  Substance Abuse Resources [facility]  [phone] [facility] [phone] [facility] [place] [phone] Floydene Flock [phone] Residential & Outpatient Substance Abuse Program  6368768484  Abuse/Neglect [facility] Child Abuse Hotline 269-010-2856 [facility] Child Abuse Hotline (587)435-5164 (After Hours)  Emergency Shelter [facility] 4403273609  Maternity Homes [facility] at the [place] [facility] [place] [phone]  [employer] Services [phone]  MRSA Hotline #:   (956)186-0610    [facility] Resources  [facility]     [employer]                          [facility] [address] [place]                       [address]      [address]  Blondell Reveal Phone:  [phone]                                   Phone:  [phone]                 Phone:  [phone]  [facility] Phone:  [phone]  [facility] Child Abuse Hotline 6092563630 [phone] (After Hours)    As we discussed it is not unreasonable to talk to your primary care doctor about prep.  Know that condoms alone do not completely illuminate the chance of sexually transmitted diseases and specifically herpes can so be transmitted with a condom on.

## 2020-12-14 LAB — RPR: RPR Ser Ql: NONREACTIVE

## 2020-12-14 LAB — HIV ANTIBODY (ROUTINE TESTING W REFLEX): HIV Screen 4th Generation wRfx: NONREACTIVE

## 2020-12-16 LAB — GC/CHLAMYDIA PROBE AMP (~~LOC~~) NOT AT ARMC
Chlamydia: NEGATIVE
Comment: NEGATIVE
Comment: NORMAL
Neisseria Gonorrhea: NEGATIVE

## 2021-04-13 ENCOUNTER — Encounter (HOSPITAL_BASED_OUTPATIENT_CLINIC_OR_DEPARTMENT_OTHER): Payer: Self-pay

## 2021-04-13 ENCOUNTER — Other Ambulatory Visit: Payer: Self-pay

## 2021-04-13 ENCOUNTER — Emergency Department (HOSPITAL_BASED_OUTPATIENT_CLINIC_OR_DEPARTMENT_OTHER)
Admission: EM | Admit: 2021-04-13 | Discharge: 2021-04-13 | Disposition: A | Discharge status: Home / Self Care | Payer: BC Managed Care – PPO | Attending: Emergency Medicine | Admitting: Emergency Medicine

## 2021-04-13 DIAGNOSIS — J0100 Acute maxillary sinusitis, unspecified: Secondary | ICD-10-CM | POA: Insufficient documentation

## 2021-04-13 DIAGNOSIS — J45909 Unspecified asthma, uncomplicated: Secondary | ICD-10-CM | POA: Insufficient documentation

## 2021-04-13 DIAGNOSIS — R0981 Nasal congestion: Secondary | ICD-10-CM | POA: Diagnosis present

## 2021-04-13 MED ORDER — AMOXICILLIN-POT CLAVULANATE 875-125 MG PO TABS: 1.0000 | ORAL_TABLET | Freq: Two times a day (BID) | ORAL | 0 refills | Status: DC

## 2021-04-13 NOTE — Discharge Instructions (Addendum)
You were seen in the ER today for your and headaches.  You have been diagnosed with sinusitis which is a bacterial sinus infection.  You have been prescribed antibiotics to take twice a day for the next week.  Please take them as prescribed for the entire course.  You may continue using your over-the-counter symptom management medications as needed.  Follow-up with your primary care doctor and return to ER with any new severe symptoms.

## 2021-04-13 NOTE — ED Triage Notes (Addendum)
Pt c/o congestion & sinus pressure/headaches x 3 weeks. Has had multiple negative covid tests
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
## 2021-10-20 ENCOUNTER — Encounter (HOSPITAL_BASED_OUTPATIENT_CLINIC_OR_DEPARTMENT_OTHER): Payer: Self-pay

## 2021-10-20 ENCOUNTER — Emergency Department (HOSPITAL_BASED_OUTPATIENT_CLINIC_OR_DEPARTMENT_OTHER): Payer: BC Managed Care – PPO

## 2021-10-20 ENCOUNTER — Emergency Department (HOSPITAL_BASED_OUTPATIENT_CLINIC_OR_DEPARTMENT_OTHER)
Admission: EM | Admit: 2021-10-20 | Discharge: 2021-10-20 | Disposition: A | Discharge status: Home / Self Care | Payer: BC Managed Care – PPO | Attending: Emergency Medicine | Admitting: Emergency Medicine

## 2021-10-20 DIAGNOSIS — M545 Low back pain, unspecified: Secondary | ICD-10-CM | POA: Insufficient documentation

## 2021-10-20 DIAGNOSIS — M7918 Myalgia, other site: Secondary | ICD-10-CM | POA: Diagnosis not present

## 2021-10-20 DIAGNOSIS — M25561 Pain in right knee: Secondary | ICD-10-CM | POA: Diagnosis present

## 2021-10-20 DIAGNOSIS — Y92410 Unspecified street and highway as the place of occurrence of the external cause: Secondary | ICD-10-CM | POA: Diagnosis not present

## 2021-10-20 MED ORDER — METHOCARBAMOL 500 MG PO TABS: 500.0000 mg | ORAL_TABLET | Freq: Two times a day (BID) | ORAL | 0 refills | Status: AC
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
## 2021-10-20 NOTE — Discharge Instructions (Signed)
Take Robaxin as needed as directed for pain.  Do not drive or operate machinery if taking Robaxin.  Can continue with Motrin and Tylenol as needed directed.  Apply warm compresses to sore muscles followed by gentle stretching.  Recheck with orthopedics if pain continues. ?

## 2021-10-29 ENCOUNTER — Encounter: Payer: Self-pay | Admitting: Family Medicine

## 2021-10-29 ENCOUNTER — Ambulatory Visit (INDEPENDENT_AMBULATORY_CARE_PROVIDER_SITE_OTHER): Payer: BC Managed Care – PPO | Admitting: Family Medicine

## 2021-10-29 VITALS — BP 114/70 | Ht 73.0 in | Wt 200.0 lb

## 2021-10-29 DIAGNOSIS — S8001XA Contusion of right knee, initial encounter: Secondary | ICD-10-CM | POA: Diagnosis not present
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
## 2021-10-29 NOTE — Patient Instructions (Signed)
Nice to meet you  ?Please try ice as needed  ?Please try the exercises  ?We have made a referral to physical therapy  ?Please send me a message in MyChart with any questions or updates.  ?Please see me back in 3-4 weeks.  ? ?--Dr. Jordan Likes ? ?

## 2021-10-30 ENCOUNTER — Encounter (HOSPITAL_BASED_OUTPATIENT_CLINIC_OR_DEPARTMENT_OTHER): Payer: Self-pay | Admitting: *Deleted

## 2021-10-30 ENCOUNTER — Emergency Department (HOSPITAL_BASED_OUTPATIENT_CLINIC_OR_DEPARTMENT_OTHER)
Admission: EM | Admit: 2021-10-30 | Discharge: 2021-10-30 | Disposition: A | Discharge status: Home / Self Care | Payer: BC Managed Care – PPO | Attending: Emergency Medicine | Admitting: Emergency Medicine

## 2021-10-30 ENCOUNTER — Other Ambulatory Visit: Payer: Self-pay

## 2021-10-30 DIAGNOSIS — Z113 Encounter for screening for infections with a predominantly sexual mode of transmission: Secondary | ICD-10-CM | POA: Diagnosis not present

## 2021-10-30 DIAGNOSIS — R8289 Other abnormal findings on cytological and histological examination of urine: Secondary | ICD-10-CM | POA: Diagnosis not present

## 2021-10-30 DIAGNOSIS — N4889 Other specified disorders of penis: Secondary | ICD-10-CM | POA: Insufficient documentation

## 2021-10-30 DIAGNOSIS — R369 Urethral discharge, unspecified: Secondary | ICD-10-CM | POA: Insufficient documentation

## 2021-10-30 DIAGNOSIS — Z711 Person with feared health complaint in whom no diagnosis is made: Secondary | ICD-10-CM

## 2021-10-30 LAB — URINALYSIS, MICROSCOPIC (REFLEX)

## 2021-10-30 LAB — URINALYSIS, ROUTINE W REFLEX MICROSCOPIC
Bilirubin Urine: NEGATIVE
Glucose, UA: NEGATIVE mg/dL
Ketones, ur: NEGATIVE mg/dL
Leukocytes,Ua: NEGATIVE
Nitrite: NEGATIVE
Protein, ur: NEGATIVE mg/dL
Specific Gravity, Urine: 1.025 (ref 1.005–1.030)
pH: 5.5 (ref 5.0–8.0)

## 2021-10-30 LAB — HIV ANTIBODY (ROUTINE TESTING W REFLEX): HIV Screen 4th Generation wRfx: NONREACTIVE

## 2021-10-30 MED ORDER — CEFTRIAXONE SODIUM 500 MG IJ SOLR
500.0000 mg | Freq: Once | INTRAMUSCULAR | Status: AC
  Administered 2021-10-30: 500 mg via INTRAMUSCULAR
  Filled 2021-10-30: qty 500

## 2021-10-30 MED ORDER — AZITHROMYCIN 250 MG PO TABS
1000.0000 mg | ORAL_TABLET | Freq: Once | ORAL | Status: AC
  Administered 2021-10-30: 1000 mg via ORAL
  Filled 2021-10-30: qty 4

## 2021-10-30 MED ORDER — METRONIDAZOLE 500 MG PO TABS
2000.0000 mg | ORAL_TABLET | Freq: Once | ORAL | Status: AC
  Administered 2021-10-30: 2000 mg via ORAL
  Filled 2021-10-30: qty 4

## 2021-10-30 MED ORDER — LIDOCAINE HCL (PF) 1 % IJ SOLN
1.0000 mL | Freq: Once | INTRAMUSCULAR | Status: AC
  Administered 2021-10-30: 1 mL
  Filled 2021-10-30: qty 5

## 2021-10-30 NOTE — ED Notes (Signed)
To restroom to provide urine spec. Having frequency and burning upon urination. Denies fevers or any lower abd pain or back pain ?

## 2021-10-30 NOTE — ED Notes (Signed)
Tolerated IM injection well, no complications noted at this time ?

## 2021-10-30 NOTE — Discharge Instructions (Addendum)
Would expect improvement over the next couple days.  Return for any new or worse symptoms.  The antibiotic she received here will cover STD.  Confirmatory labs are pending. ?

## 2021-10-30 NOTE — ED Provider Notes (Addendum)
?[facility] EM[facility]ENC[facility] DEPARTMENT ?Provider Note ? ? ?CSN: [id number] ?Arrival date & time: 10/30/21  0820 ? ?  ? ?History ? ?Chief Complaint  ?Patient presents with  ? Groin Pain  ? ? ?Alejandro Lyons is a 26 y.o. male. ? ?Patient presenting with concern for penile discharge yellow drainage and pain with voiding.  No fevers last week did have unprotected sex.  Denies any abdominal pain back pain.  Patient is concerned about STD exposure. ? ?Chart review shows that patient has been evaluated in the past for concerns for STD.  Last time was June 2022.  Past medical history significant for gonorrhea and asthma. ? ? ?  ? ?Home Medications ?Prior to Admission medications   ?Medication Sig Start Date End Date Taking? Authorizing Provider  ?methocarbamol (ROBAXIN) 500 MG tablet Take 1 tablet (500 mg total) by mouth 2 (two) times daily. 10/20/21   Jeannie Fend, PA-C  ?   ? ?Allergies    ?Patient has no known allergies.   ? ?Review of Systems   ?Review of Systems  ?Constitutional:  Negative for chills and fever.  ?HENT:  Negative for ear pain and sore throat.   ?Eyes:  Negative for pain and visual disturbance.  ?Respiratory:  Negative for cough and shortness of breath.   ?Cardiovascular:  Negative for chest pain and palpitations.  ?Gastrointestinal:  Negative for abdominal pain and vomiting.  ?Genitourinary:  Positive for dysuria, penile discharge and urgency. Negative for hematuria.  ?Musculoskeletal:  Negative for arthralgias and back pain.  ?Skin:  Negative for color change and rash.  ?Neurological:  Negative for seizures and syncope.  ?All other systems reviewed and are negative. ? ?Physical Exam ?Updated Vital Signs ?BP 132/74 (BP Location: Right Arm)   Pulse 70   Temp 98.2 ?F (36.8 ?C) (Oral)   Resp 18   SpO2 100%  ?Physical Exam ?Vitals and nursing note reviewed.  ?Constitutional:   ?   General: He is not in acute distress. ?   Appearance: Normal appearance. He is well-developed.  ?HENT:  ?    Head: Normocephalic and atraumatic.  ?Eyes:  ?   Extraocular Movements: Extraocular movements intact.  ?   Conjunctiva/sclera: Conjunctivae normal.  ?   Pupils: Pupils are equal, round, and reactive to light.  ?Cardiovascular:  ?   Rate and Rhythm: Normal rate and regular rhythm.  ?   Heart sounds: No murmur heard. ?Pulmonary:  ?   Effort: Pulmonary effort is normal. No respiratory distress.  ?   Breath sounds: Normal breath sounds.  ?Abdominal:  ?   Palpations: Abdomen is soft.  ?   Tenderness: There is no abdominal tenderness.  ?Genitourinary: ?   Penis: Normal.   ?   Comments: Watery discharge from tip of penis.  No lesions.  Patient is circumcised.  Testes nontender.  No distinct mass.  No groin adenopathy. ?Musculoskeletal:     ?   General: No swelling.  ?   Cervical back: Neck supple.  ?Skin: ?   General: Skin is warm and dry.  ?   Capillary Refill: Capillary refill takes less than 2 seconds.  ?   Findings: No rash.  ?Neurological:  ?   General: No focal deficit present.  ?   Mental Status: He is alert and oriented to person, place, and time.  ?Psychiatric:     ?   Mood and Affect: Mood normal.  ? ? ?ED Results / Procedures / Treatments   ?Labs ?(all  labs ordered are listed, but only abnormal results are displayed) ?Labs Reviewed  ?URINALYSIS, ROUTINE W REFLEX MICROSCOPIC  ?RPR  ?HIV ANTIBODY (ROUTINE TESTING W REFLEX)  ?GC/CHLAMYDIA PROBE AMP ([place]) NOT AT [facility]  ? ? ?EKG ?None ? ?Radiology ?No results found. ? ?Procedures ?Procedures  ? ? ?Medications Ordered in ED ?Medications  ?cefTRIAXone (ROCEPHIN) injection 500 mg (500 mg Intramuscular Given 10/30/21 0857)  ?lidocaine (PF) (XYLOCAINE) 1 % injection 1 mL (1 mL Other Given 10/30/21 0856)  ?azithromycin (ZITHROMAX) tablet 1,000 mg (1,000 mg Oral Given 10/30/21 0849)  ?metroNIDAZOLE (FLAGYL) tablet 2,000 mg (2,000 mg Oral Given 10/30/21 0850)  ? ? ?ED Course/ Medical Decision Making/ A&P ?  ?                        ?Medical Decision Making ?Amount and/or  Complexity of Data Reviewed ?Labs: ordered. ? ?Risk ?Prescription drug management. ? ? ?Patient with a watery discharge.  But concerned about STD.  Patient treated with Rocephin Zithromax and Flagyl here.  Culture for GC chlamydia pending.  Labs pending. ? ?Urinalysis negative.  Microscopic showed 6-10 whites bacteria rare.  RBCs were 0-5.  Not consistent with urinary tract infection. ? ?Final Clinical Impression(s) / ED Diagnoses ?Final diagnoses:  ?Concern about STD in male without diagnosis  ? ? ?Rx / DC Orders ?ED Discharge Orders   ? ? None  ? ?  ? ? ?  ?Vanetta Mulders, MD ?10/30/21 0913 ? ?  ?Vanetta Mulders, MD ?10/30/21 [phone] ? ?

## 2021-10-30 NOTE — ED Triage Notes (Signed)
Last night noted to begin have penile drainage, yellow drainage, when voiding, has pain. No fevers. Last week had unprotected sex. Denies any abd pain or back pain, Having urgency as well.  ?

## 2021-10-31 LAB — RPR: RPR Ser Ql: NONREACTIVE

## 2021-10-31 LAB — GC/CHLAMYDIA PROBE AMP (~~LOC~~) NOT AT ARMC
Chlamydia: POSITIVE — AB
Comment: NEGATIVE
Comment: NORMAL
Neisseria Gonorrhea: NEGATIVE

## 2021-11-19 ENCOUNTER — Ambulatory Visit (INDEPENDENT_AMBULATORY_CARE_PROVIDER_SITE_OTHER): Payer: BC Managed Care – PPO | Admitting: Family Medicine

## 2021-11-19 ENCOUNTER — Encounter: Payer: Self-pay | Admitting: Family Medicine

## 2021-11-19 DIAGNOSIS — S8001XD Contusion of right knee, subsequent encounter: Secondary | ICD-10-CM
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
## 2021-12-18 ENCOUNTER — Other Ambulatory Visit (HOSPITAL_BASED_OUTPATIENT_CLINIC_OR_DEPARTMENT_OTHER): Payer: Self-pay

## 2021-12-18 ENCOUNTER — Emergency Department (HOSPITAL_BASED_OUTPATIENT_CLINIC_OR_DEPARTMENT_OTHER)
Admission: EM | Admit: 2021-12-18 | Discharge: 2021-12-18 | Disposition: A | Discharge status: Home / Self Care | Payer: BC Managed Care – PPO | Attending: Emergency Medicine | Admitting: Emergency Medicine

## 2021-12-18 ENCOUNTER — Other Ambulatory Visit: Payer: Self-pay

## 2021-12-18 ENCOUNTER — Encounter (HOSPITAL_BASED_OUTPATIENT_CLINIC_OR_DEPARTMENT_OTHER): Payer: Self-pay

## 2021-12-18 DIAGNOSIS — A64 Unspecified sexually transmitted disease: Secondary | ICD-10-CM | POA: Insufficient documentation

## 2021-12-18 DIAGNOSIS — N50811 Right testicular pain: Secondary | ICD-10-CM | POA: Diagnosis present

## 2021-12-18 LAB — URINALYSIS, ROUTINE W REFLEX MICROSCOPIC
Bilirubin Urine: NEGATIVE
Glucose, UA: NEGATIVE mg/dL
Hgb urine dipstick: NEGATIVE
Ketones, ur: NEGATIVE mg/dL
Leukocytes,Ua: NEGATIVE
Nitrite: NEGATIVE
Protein, ur: NEGATIVE mg/dL
Specific Gravity, Urine: >=1.03 (ref 1.005–1.030)
pH: 5.5 (ref 5.0–8.0)

## 2021-12-18 LAB — HIV ANTIBODY (ROUTINE TESTING W REFLEX): HIV Screen 4th Generation wRfx: NONREACTIVE

## 2021-12-18 MED ORDER — DOXYCYCLINE HYCLATE 100 MG PO CAPS
100.0000 mg | ORAL_CAPSULE | Freq: Two times a day (BID) | ORAL | 0 refills | Status: DC
  Filled 2021-12-18: qty 14, 7d supply, fill #0

## 2021-12-18 MED ORDER — DOXYCYCLINE HYCLATE 100 MG PO CAPS: 100.0000 mg | ORAL_CAPSULE | Freq: Two times a day (BID) | ORAL | 0 refills | Status: AC

## 2021-12-18 MED ORDER — LIDOCAINE HCL (PF) 1 % IJ SOLN
INTRAMUSCULAR | Status: AC
  Administered 2021-12-18: 5 mL
  Filled 2021-12-18: qty 5

## 2021-12-18 MED ORDER — CEFTRIAXONE SODIUM 500 MG IJ SOLR
500.0000 mg | Freq: Once | INTRAMUSCULAR | Status: AC
  Administered 2021-12-18: 500 mg via INTRAMUSCULAR
  Filled 2021-12-18: qty 500

## 2021-12-18 NOTE — Discharge Instructions (Addendum)
If your test comes back positive for gonorrhea or chlamydia your partners need to be treated.  Also you should wear protection every time to prevent getting a infection in the future.  Also you need to abstain from sex for the next 2 weeks as infection is clearing.

## 2021-12-18 NOTE — ED Triage Notes (Addendum)
Patient states he has had penis and testicular pain for about 1 week. Endorses some nausea and vomiting. States he may have concern for STD.

## 2021-12-19 LAB — RPR: RPR Ser Ql: NONREACTIVE

## 2022-04-29 ENCOUNTER — Other Ambulatory Visit: Payer: Self-pay

## 2022-04-29 ENCOUNTER — Encounter (HOSPITAL_BASED_OUTPATIENT_CLINIC_OR_DEPARTMENT_OTHER): Payer: Self-pay | Admitting: Emergency Medicine

## 2022-04-29 ENCOUNTER — Emergency Department (HOSPITAL_BASED_OUTPATIENT_CLINIC_OR_DEPARTMENT_OTHER)
Admission: EM | Admit: 2022-04-29 | Discharge: 2022-04-29 | Disposition: A | Discharge status: Home / Self Care | Payer: BC Managed Care – PPO | Attending: Emergency Medicine | Admitting: Emergency Medicine

## 2022-04-29 DIAGNOSIS — R030 Elevated blood-pressure reading, without diagnosis of hypertension: Secondary | ICD-10-CM | POA: Diagnosis not present

## 2022-04-29 DIAGNOSIS — N341 Nonspecific urethritis: Secondary | ICD-10-CM | POA: Insufficient documentation

## 2022-04-29 DIAGNOSIS — R369 Urethral discharge, unspecified: Secondary | ICD-10-CM

## 2022-04-29 DIAGNOSIS — N342 Other urethritis: Secondary | ICD-10-CM

## 2022-04-29 LAB — URINALYSIS, ROUTINE W REFLEX MICROSCOPIC
Bilirubin Urine: NEGATIVE
Glucose, UA: NEGATIVE mg/dL
Hgb urine dipstick: NEGATIVE
Ketones, ur: NEGATIVE mg/dL
Leukocytes,Ua: NEGATIVE
Nitrite: NEGATIVE
Protein, ur: NEGATIVE mg/dL
Specific Gravity, Urine: 1.01 (ref 1.005–1.030)
pH: 6 (ref 5.0–8.0)

## 2022-04-29 MED ORDER — AZITHROMYCIN 250 MG PO TABS
1000.0000 mg | ORAL_TABLET | Freq: Once | ORAL | Status: AC
  Administered 2022-04-29: 1000 mg via ORAL
  Filled 2022-04-29: qty 4

## 2022-04-29 MED ORDER — CEFTRIAXONE SODIUM 500 MG IJ SOLR
500.0000 mg | Freq: Once | INTRAMUSCULAR | Status: AC
  Administered 2022-04-29: 500 mg via INTRAMUSCULAR
  Filled 2022-04-29: qty 500

## 2022-04-29 MED ORDER — LIDOCAINE HCL (PF) 1 % IJ SOLN
INTRAMUSCULAR | Status: AC
  Administered 2022-04-29: 1 mL
  Filled 2022-04-29: qty 5

## 2022-04-29 NOTE — Discharge Instructions (Signed)
It was our pleasure to provide your ER care today - we hope that you feel better.  Follow up with primary care doctor in 1-2 weeks if symptoms fail to improve/resolve.  No sex until after symptoms have completely resolved, then use condoms.   Have any sexual contacts checked by their doctor or county health department.   Your blood pressure is mildly high today - follow up with primary care doctor in 1-2 weeks.   Return to ER if worse, new symptoms, fevers, or other concern.

## 2022-04-29 NOTE — ED Provider Notes (Signed)
[facility] Provider Note   CSN: [id number] Arrival date & time: 04/29/22  1350     History  Chief Complaint  Patient presents with   Dysuria   Penile Discharge    Alejandro Lyons is a 26 y.o. male.  Pt c/o dysuria at tip of penis and penile discharge for  the past 1-2 weeks. Symptoms acute onset, persistent. No hx uti. Is sexually active. No known std exposure. No skin lesions/ulcers or rash. No sore throat. No fever or chills. States otherwise does not feel sick or ill. No abd pain. No nv.   The history is provided by the patient and medical records.  Dysuria Presenting symptoms: dysuria and penile discharge   Associated symptoms: no fever, no nausea, no scrotal swelling and no vomiting   Penile Discharge Pertinent negatives include no headaches.       Home Medications Prior to Admission medications   Medication Sig Start Date End Date Taking? Authorizing Provider  doxycycline (VIBRAMYCIN) 100 MG capsule Take 1 capsule (100 mg total) by mouth 2 (two) times daily. 12/18/21   Blanchie Dessert, MD  methocarbamol (ROBAXIN) 500 MG tablet Take 1 tablet (500 mg total) by mouth 2 (two) times daily. 10/20/21   Tacy Learn, PA-C      Allergies    Patient has no known allergies.    Review of Systems   Review of Systems  Constitutional:  Negative for chills and fever.  HENT:  Negative for sore throat.   Gastrointestinal:  Negative for nausea and vomiting.  Genitourinary:  Positive for dysuria and penile discharge. Negative for scrotal swelling and testicular pain.  Skin:  Negative for rash.  Neurological:  Negative for headaches.    Physical Exam Updated Vital Signs BP (!) 147/83 (BP Location: Left Arm)   Pulse 85   Temp 98.3 F (36.8 C) (Oral)   Resp 18   Ht 1.854 m (6\' 1" )   Wt 90.7 kg   SpO2 96%   BMI 26.39 kg/m  Physical Exam Vitals and nursing note reviewed.  Constitutional:      Appearance: Normal appearance. He is  well-developed.  HENT:     Head: Atraumatic.     Nose: Nose normal.     Mouth/Throat:     Mouth: Mucous membranes are moist.  Eyes:     General: No scleral icterus.    Conjunctiva/sclera: Conjunctivae normal.  Neck:     Trachea: No tracheal deviation.  Cardiovascular:     Rate and Rhythm: Normal rate.     Pulses: Normal pulses.     Heart sounds: Normal heart sounds.  Pulmonary:     Effort: Pulmonary effort is normal. No accessory muscle usage or respiratory distress.  Abdominal:     General: There is no distension.     Palpations: Abdomen is soft.     Tenderness: There is no abdominal tenderness.  Genitourinary:    Comments: Normal external gu exam. Scant penile d/c.  Musculoskeletal:        General: No swelling.     Cervical back: Neck supple.  Skin:    General: Skin is warm and dry.     Findings: No rash.  Neurological:     Mental Status: He is alert.     Comments: Alert, speech clear.   Psychiatric:        Mood and Affect: Mood normal.     ED Results / Procedures / Treatments   Labs (all labs ordered  are listed, but only abnormal results are displayed) Labs Reviewed  URINE CULTURE  URINALYSIS, ROUTINE W REFLEX MICROSCOPIC  GC/CHLAMYDIA PROBE AMP ([place]) NOT AT [facility]    EKG None  Radiology No results found.  Procedures Procedures    Medications Ordered in ED Medications  cefTRIAXone (ROCEPHIN) injection 500 mg (has no administration in time range)  azithromycin (ZITHROMAX) tablet 1,000 mg (has no administration in time range)    ED Course/ Medical Decision Making/ A&P                           Medical Decision Making Problems Addressed: Elevated blood pressure reading: acute illness or injury Penile discharge: acute illness or injury Urethritis: acute illness or injury  Amount and/or Complexity of Data Reviewed External Data Reviewed: labs and notes. Labs: ordered. Decision-making details documented in ED Course.  Risk Prescription  drug management.   Labs/urethral swab done and sent.   Discussed tx options w pt, waiting for labs vs  tx. Patient opts that we give him tx in ED.   Confirmed nkda.  Rocephin IM. Zithromax po.   Pt appears stable for d/c.  Rec pcp f/u.  Return precautions provided.          Final Clinical Impression(s) / ED Diagnoses Final diagnoses:  None    Rx / DC Orders ED Discharge Orders     None         Cathren Laine, MD 04/29/22 1504

## 2022-04-29 NOTE — ED Triage Notes (Signed)
Dysuria and penile discharge x 2 weeks . Unprotected sexual intercourse 3 weeks ago .

## 2022-04-30 LAB — GC/CHLAMYDIA PROBE AMP (~~LOC~~) NOT AT ARMC
Chlamydia: NEGATIVE
Comment: NEGATIVE
Comment: NORMAL
Neisseria Gonorrhea: NEGATIVE

## 2022-04-30 LAB — URINE CULTURE: Culture: NO GROWTH

## 2022-10-12 ENCOUNTER — Encounter: Payer: Self-pay | Admitting: *Deleted
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
